# Patient Record
Sex: Male | Born: 2000 | Race: Black or African American | Hispanic: No | Marital: Single | Smoking: Never smoker
Health system: Southern US, Community
[De-identification: ages and names within clinical notes are randomized; demographics above are authoritative.]

## PROBLEM LIST (undated history)

## (undated) ENCOUNTER — Ambulatory Visit: Admission: EM | Payer: Self-pay | Source: Home / Self Care

## (undated) ENCOUNTER — Ambulatory Visit (HOSPITAL_COMMUNITY): Admission: EM | Payer: MEDICAID | Source: Home / Self Care

## (undated) DIAGNOSIS — F909 Attention-deficit hyperactivity disorder, unspecified type: Secondary | ICD-10-CM

## (undated) DIAGNOSIS — Z9109 Other allergy status, other than to drugs and biological substances: Secondary | ICD-10-CM

## (undated) HISTORY — PX: NO PAST SURGERIES: SHX2092

---
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
## 2021-02-28 ENCOUNTER — Ambulatory Visit (HOSPITAL_COMMUNITY)
Admission: EM | Admit: 2021-02-28 | Discharge: 2021-02-28 | Disposition: A | Discharge status: Home / Self Care | Payer: Medicaid Other | Attending: Family Medicine | Admitting: Family Medicine

## 2021-02-28 ENCOUNTER — Encounter (HOSPITAL_COMMUNITY): Payer: Self-pay | Admitting: *Deleted

## 2021-02-28 ENCOUNTER — Other Ambulatory Visit: Payer: Self-pay

## 2021-02-28 DIAGNOSIS — R369 Urethral discharge, unspecified: Secondary | ICD-10-CM | POA: Diagnosis present

## 2021-02-28 DIAGNOSIS — Z711 Person with feared health complaint in whom no diagnosis is made: Secondary | ICD-10-CM

## 2021-02-28 MED ORDER — DOXYCYCLINE HYCLATE 100 MG PO CAPS: 100.0000 mg | ORAL_CAPSULE | Freq: Two times a day (BID) | ORAL | 0 refills | Status: DC

## 2021-02-28 MED ORDER — CEFTRIAXONE SODIUM 500 MG IJ SOLR
500.0000 mg | Freq: Once | INTRAMUSCULAR | Status: AC
  Administered 2021-02-28: 500 mg via INTRAMUSCULAR

## 2021-02-28 MED ORDER — CEFTRIAXONE SODIUM 500 MG IJ SOLR
INTRAMUSCULAR | Status: AC
  Filled 2021-02-28: qty 500

## 2021-02-28 MED ORDER — LIDOCAINE HCL (PF) 1 % IJ SOLN
INTRAMUSCULAR | Status: AC
  Filled 2021-02-28: qty 2

## 2021-02-28 NOTE — ED Provider Notes (Signed)
[facility]   [id number] 02/28/21 Arrival Time: 1552  ASSESSMENT & PLAN:  1. Penile discharge   2. Concern about STD in male without diagnosis    Meds ordered this encounter  Medications  . cefTRIAXone (ROCEPHIN) injection 500 mg  . doxycycline (VIBRAMYCIN) 100 MG capsule    Sig: Take 1 capsule (100 mg total) by mouth 2 (two) times daily.    Dispense:  14 capsule    Refill:  0     Discharge Instructions     You have been given the following today for treatment of suspected gonorrhea and/or chlamydia:  cefTRIAXone (ROCEPHIN) injection 500 mg  Please pick up your prescription for doxycycline 100 mg and begin taking twice daily for the next seven (7) days.  Even though we have treated you today, we have sent testing for sexually transmitted infections. We will notify you of any positive results once they are received. If required, we will prescribe any medications you might need.  Please refrain from all sexual activity for at least the next seven days.     Pending: Labs Reviewed  CYTOLOGY, (ORAL, ANAL, URETHRAL) ANCILLARY ONLY   Declines HIV/RPR.  Will notify of any positive results. Instructed to refrain from sexual activity for at least seven days.  Reviewed expectations re: course of current medical issues. Questions answered. Outlined signs and symptoms indicating need for more acute intervention. Patient verbalized understanding. After Visit Summary given.   SUBJECTIVE:  Brian Dawson is a 20 y.o. male who presents with complaint of penile discharge. Onset abrupt. First noticed yesterday. Describes discharge as thick and green and yellow. No specific aggravating or alleviating factors reported. Denies: urinary frequency, dysuria and gross hematuria. Afebrile. No abdominal or pelvic pain. No n/v. No rashes or lesions. Reports that he is sexually active with single male partner. OTC treatment: none. History of STI: none  reported.  OBJECTIVE:  Vitals:   02/28/21 1643  BP: 123/67  Pulse: (!) 59  Resp: 16  Temp: 98.7 F (37.1 C)  TempSrc: Oral  SpO2: 100%    General appearance: alert, cooperative, appears stated age and no distress Lungs: unlabored respirations; speaks full sentences without difficulty Abdomen: soft, non-tender GU: normal appearing genitalia Skin: warm and dry Psychological: alert and cooperative; normal mood and affect.  Labs Reviewed  CYTOLOGY, (ORAL, ANAL, URETHRAL) ANCILLARY ONLY    No Known Allergies  Past Medical History:  Diagnosis Date  . ADHD (attention deficit hyperactivity disorder)   . Pollen allergies    History reviewed. No pertinent family history. Social History   Socioeconomic History  . Marital status: Single    Spouse name: Not on file  . Number of children: Not on file  . Years of education: Not on file  . Highest education level: Not on file  Occupational History  . Not on file  Tobacco Use  . Smoking status: Never Smoker  . Smokeless tobacco: Never Used  Substance and Sexual Activity  . Alcohol use: Not on file  . Drug use: Not on file  . Sexual activity: Not on file  Other Topics Concern  . Not on file  Social History Narrative  . Not on file   Social Determinants of Health   Financial Resource Strain: Not on file  Food Insecurity: Not on file  Transportation Needs: Not on file  Physical Activity: Not on file  Stress: Not on file  Social Connections: Not on file  Intimate Partner Violence: Not on file  Mardella Layman, MD 02/28/21 1715

## 2021-02-28 NOTE — Discharge Instructions (Addendum)

## 2021-02-28 NOTE — ED Triage Notes (Signed)
STD no sx till yesterday . Penis diccharge.

## 2021-03-01 LAB — CYTOLOGY, (ORAL, ANAL, URETHRAL) ANCILLARY ONLY
Chlamydia: NEGATIVE
Comment: NEGATIVE
Comment: NEGATIVE
Comment: NORMAL
Neisseria Gonorrhea: POSITIVE — AB
Trichomonas: NEGATIVE

## 2021-03-23 ENCOUNTER — Ambulatory Visit (HOSPITAL_BASED_OUTPATIENT_CLINIC_OR_DEPARTMENT_OTHER): Payer: Medicaid Other | Admitting: Nurse Practitioner

## 2021-06-26 ENCOUNTER — Encounter (HOSPITAL_COMMUNITY): Payer: Self-pay

## 2021-06-26 ENCOUNTER — Ambulatory Visit (HOSPITAL_COMMUNITY)
Admission: EM | Admit: 2021-06-26 | Discharge: 2021-06-26 | Disposition: A | Discharge status: Home / Self Care | Payer: Medicaid Other | Attending: Student | Admitting: Student

## 2021-06-26 DIAGNOSIS — R300 Dysuria: Secondary | ICD-10-CM | POA: Diagnosis not present

## 2021-06-26 DIAGNOSIS — Z113 Encounter for screening for infections with a predominantly sexual mode of transmission: Secondary | ICD-10-CM | POA: Insufficient documentation

## 2021-06-26 MED ORDER — CEFTRIAXONE SODIUM 500 MG IJ SOLR
500.0000 mg | Freq: Once | INTRAMUSCULAR | Status: AC
  Administered 2021-06-26: 500 mg via INTRAMUSCULAR

## 2021-06-26 MED ORDER — LIDOCAINE HCL (PF) 1 % IJ SOLN
INTRAMUSCULAR | Status: AC
  Filled 2021-06-26: qty 2

## 2021-06-26 MED ORDER — CEFTRIAXONE SODIUM 500 MG IJ SOLR
INTRAMUSCULAR | Status: AC
  Filled 2021-06-26: qty 500

## 2021-06-26 NOTE — Discharge Instructions (Addendum)
-  We have sent testing for sexually transmitted infections. We will notify you of any positive results once they are received. If required, we will prescribe any additional medications you might need. Please refrain from all sexual activity until treatment is complete.  -Seek additional medical attention if you develop fevers/chills, new/worsening abdominal pain, new/worsening vaginal discomfort/discharge, etc.

## 2021-06-26 NOTE — ED Triage Notes (Signed)
Pt in with c/o pain during urination and possible std exposure  States he received messages stating that he needs to go get tested

## 2021-06-26 NOTE — ED Notes (Signed)
Called in lobby no answered.  

## 2021-06-26 NOTE — ED Provider Notes (Signed)
[facility]    CSN: [id number] Arrival date & time: 06/26/21  1033      History   Chief Complaint Chief Complaint  Patient presents with   pain during urination    HPI Brian Dawson is a 20 y.o. male presenting with dysuria x3 days following unprotected intercourse with male partner. States this partner gave him gonorrhea about 3 months ago, he was treated at our urgent care with resolution of the symptoms at the time.  Had repeat unprotected intercourse with same partner and states he does not think that she was treated.  One of her other partners actually texted him this morning saying that he should get checked for STIs, and so he is concerned that he has gonorrhea again.  Endorses dysuria, but denies other penile symptoms including discharge, smell, swelling, lesions.  HPI  Past Medical History:  Diagnosis Date   ADHD (attention deficit hyperactivity disorder)    Pollen allergies     There are no problems to display for this patient.   History reviewed. No pertinent surgical history.     Home Medications    Prior to Admission medications   Medication Sig Start Date End Date Taking? Authorizing Provider  cephALEXin (KEFLEX) 500 MG capsule Take 1 capsule (500 mg total) by mouth 4 (four) times daily. 09/26/16   Muthersbaugh, Dahlia Client, PA-C  Dexmethylphenidate HCl 30 MG CP24 Take 1 capsule (30 mg total) by mouth daily after breakfast. 10/03/15 10/12/15  Dowless, Lelon Mast Tripp, PA-C  doxycycline (VIBRAMYCIN) 100 MG capsule Take 1 capsule (100 mg total) by mouth 2 (two) times daily. 02/28/21   Mardella Layman, MD  hydrocortisone 2.5 % lotion Apply topically 2 (two) times daily. For one week 11/29/14   Truddie Coco, DO  naproxen (NAPROSYN) 500 MG tablet Take 1 tablet (500 mg total) by mouth 2 (two) times daily. 03/11/16   Niel Hummer, MD  ondansetron (ZOFRAN ODT) 4 MG disintegrating tablet Take 1 tablet (4 mg total) by mouth every 8 (eight) hours as needed. 01/17/20    Mabe, Latanya Maudlin, MD  polyethylene glycol powder (GLYCOLAX/MIRALAX) powder 1 capful in 8 oz of liquid daily as needed to have 1-2 soft bm 12/10/14   Niel Hummer, MD    Family History History reviewed. No pertinent family history.  Social History Social History   Tobacco Use   Smoking status: Never   Smokeless tobacco: Never  Vaping Use   Vaping Use: Never used  Substance Use Topics   Alcohol use: Never   Drug use: Never     Allergies   Patient has no known allergies.   Review of Systems Review of Systems  Constitutional:  Negative for chills and fever.  HENT:  Negative for sore throat.   Eyes:  Negative for pain and redness.  Respiratory:  Negative for shortness of breath.   Cardiovascular:  Negative for chest pain.  Gastrointestinal:  Negative for abdominal pain, diarrhea, nausea and vomiting.  Genitourinary:  Positive for dysuria. Negative for decreased urine volume, difficulty urinating, flank pain, frequency, genital sores, hematuria, penile pain, penile swelling, scrotal swelling, testicular pain and urgency.  Musculoskeletal:  Negative for back pain.  Skin:  Negative for rash.  All other systems reviewed and are negative.   Physical Exam Triage Vital Signs ED Triage Vitals  Enc Vitals Group     BP      Pulse      Resp      Temp      Temp src  SpO2      Weight      Height      Head Circumference      Peak Flow      Pain Score      Pain Loc      Pain Edu?      Excl. in GC?    No data found.  Updated Vital Signs BP 132/89 (BP Location: Right Arm)   Pulse 73   Temp 99.1 F (37.3 C) (Oral)   Resp 20   SpO2 97%   Visual Acuity Right Eye Distance:   Left Eye Distance:   Bilateral Distance:    Right Eye Near:   Left Eye Near:    Bilateral Near:     Physical Exam Vitals reviewed.  Constitutional:      General: He is not in acute distress.    Appearance: Normal appearance. He is not ill-appearing.  HENT:     Head: Normocephalic and  atraumatic.     Mouth/Throat:     Mouth: Mucous membranes are moist.     Comments: Moist mucous membranes Eyes:     Extraocular Movements: Extraocular movements intact.     Pupils: Pupils are equal, round, and reactive to light.  Cardiovascular:     Rate and Rhythm: Normal rate and regular rhythm.     Heart sounds: Normal heart sounds.  Pulmonary:     Effort: Pulmonary effort is normal.     Breath sounds: Normal breath sounds. No wheezing, rhonchi or rales.  Abdominal:     General: Bowel sounds are normal. There is no distension.     Palpations: Abdomen is soft. There is no mass.     Tenderness: There is no abdominal tenderness. There is no right CVA tenderness, left CVA tenderness, guarding or rebound.  Genitourinary:    Comments: deferred Skin:    General: Skin is warm.     Capillary Refill: Capillary refill takes less than 2 seconds.     Comments: Good skin turgor  Neurological:     General: No focal deficit present.     Mental Status: He is alert and oriented to person, place, and time.  Psychiatric:        Mood and Affect: Mood normal.        Behavior: Behavior normal.     UC Treatments / Results  Labs (all labs ordered are listed, but only abnormal results are displayed) Labs Reviewed  CYTOLOGY, (ORAL, ANAL, URETHRAL) ANCILLARY ONLY    EKG   Radiology No results found.  Procedures Procedures (including critical care time)  Medications Ordered in UC Medications  cefTRIAXone (ROCEPHIN) injection 500 mg (has no administration in time range)    Initial Impression / Assessment and Plan / UC Course  I have reviewed the triage vital signs and the nursing notes.  Pertinent labs & imaging results that were available during my care of the patient were reviewed by me and considered in my medical decision making (see chart for details).     This patient is a very pleasant 20 y.o. year old male presenting with dysuria/ concern for STI. Afebrile, nontachycardic, no  reproducible abd pain or CVAT.  Positive for gonorrhea 02/2021, treated appropriately with Rocephin. Recently had repeat unprotected intercourse with same partner.   Will send self-swab for G/C, trich. Declines HIV, RPR. Safe sex precautions.   Will treat with Rocephin today.   ED return precautions discussed. Patient verbalizes understanding and agreement.   Coding Level 4 for  review of past notes/labs, order and interpretation of labs today, and prescription drug management  Final Clinical Impressions(s) / UC Diagnoses   Final diagnoses:  Dysuria  Routine screening for STI (sexually transmitted infection)     Discharge Instructions      -We have sent testing for sexually transmitted infections. We will notify you of any positive results once they are received. If required, we will prescribe any additional medications you might need. Please refrain from all sexual activity until treatment is complete.  -Seek additional medical attention if you develop fevers/chills, new/worsening abdominal pain, new/worsening vaginal discomfort/discharge, etc.       ED Prescriptions   None    PDMP not reviewed this encounter.   Rhys Martini, PA-C 06/26/21 1202

## 2021-06-28 LAB — CYTOLOGY, (ORAL, ANAL, URETHRAL) ANCILLARY ONLY
Chlamydia: NEGATIVE
Comment: NEGATIVE
Comment: NEGATIVE
Comment: NORMAL
Neisseria Gonorrhea: POSITIVE — AB
Trichomonas: NEGATIVE

## 2022-03-09 ENCOUNTER — Ambulatory Visit (HOSPITAL_COMMUNITY): Payer: Self-pay

## 2022-05-03 ENCOUNTER — Ambulatory Visit (HOSPITAL_COMMUNITY): Payer: Self-pay

## 2022-05-31 ENCOUNTER — Ambulatory Visit (HOSPITAL_COMMUNITY): Payer: Self-pay

## 2022-06-05 ENCOUNTER — Encounter (HOSPITAL_COMMUNITY): Payer: Self-pay | Admitting: Emergency Medicine

## 2022-06-05 ENCOUNTER — Ambulatory Visit (HOSPITAL_COMMUNITY)
Admission: EM | Admit: 2022-06-05 | Discharge: 2022-06-05 | Disposition: A | Discharge status: Home / Self Care | Payer: Medicaid Other | Attending: Physician Assistant | Admitting: Physician Assistant

## 2022-06-05 ENCOUNTER — Ambulatory Visit (HOSPITAL_COMMUNITY): Admission: EM | Admit: 2022-06-05 | Discharge: 2022-06-05 | Discharge status: Absent without leave | Payer: Self-pay

## 2022-06-05 DIAGNOSIS — Z113 Encounter for screening for infections with a predominantly sexual mode of transmission: Secondary | ICD-10-CM | POA: Insufficient documentation

## 2022-06-05 DIAGNOSIS — Z202 Contact with and (suspected) exposure to infections with a predominantly sexual mode of transmission: Secondary | ICD-10-CM | POA: Diagnosis present

## 2022-06-05 LAB — HIV ANTIBODY (ROUTINE TESTING W REFLEX): HIV Screen 4th Generation wRfx: NONREACTIVE

## 2022-06-05 MED ORDER — LIDOCAINE HCL (PF) 1 % IJ SOLN
INTRAMUSCULAR | Status: AC
  Filled 2022-06-05: qty 2

## 2022-06-05 MED ORDER — CEFTRIAXONE SODIUM 500 MG IJ SOLR
INTRAMUSCULAR | Status: AC
  Filled 2022-06-05: qty 500

## 2022-06-05 MED ORDER — DOXYCYCLINE HYCLATE 100 MG PO CAPS: 100.0000 mg | ORAL_CAPSULE | Freq: Two times a day (BID) | ORAL | 0 refills | Status: DC

## 2022-06-05 MED ORDER — CEFTRIAXONE SODIUM 500 MG IJ SOLR
500.0000 mg | Freq: Once | INTRAMUSCULAR | Status: AC
  Administered 2022-06-05: 500 mg via INTRAMUSCULAR

## 2022-06-05 NOTE — Discharge Instructions (Signed)
We are treating you for gonorrhea and chlamydia.  We will contact you if your testing is positive for anything else that we need to arrange treatment.  Please abstain from sex for him 7 days after you complete your treatment (a minimum of 2 weeks).  You should use a condom with each sexual encounter.  If you develop any symptoms please return for reevaluation.

## 2022-06-05 NOTE — ED Triage Notes (Signed)
Pt sexual partner informed that they were positive for gonorrhea and chlamydia, so patient needing treatment. Denies any s/s.

## 2022-06-05 NOTE — ED Provider Notes (Signed)
[facility]    CSN: [id number] Arrival date & time: 06/05/22  0957      History   Chief Complaint Chief Complaint  Patient presents with   Exposure to STD    HPI Brian Dawson is a 21 y.o. male.   Patient presents today requesting treatment for gonorrhea and chlamydia.  Reports that he is sexually active with a male partner and she recently went to her OB/GYN and was told that she is positive for gonorrhea and chlamydia.  He is currently asymptomatic but requesting treatment.  He does have a history of STI and has been treated as recently as September 2022.  He denies any additional antibiotic use recently.  He denies any penile discharge, pelvic pain, abdominal pain, fever, nausea, vomiting.    Past Medical History:  Diagnosis Date   ADHD (attention deficit hyperactivity disorder)    Pollen allergies     There are no problems to display for this patient.   History reviewed. No pertinent surgical history.     Home Medications    Prior to Admission medications   Medication Sig Start Date End Date Taking? Authorizing Provider  Dexmethylphenidate HCl 30 MG CP24 Take 1 capsule (30 mg total) by mouth daily after breakfast. 10/03/15 10/12/15  Dowless, Lelon Mast Tripp, PA-C  doxycycline (VIBRAMYCIN) 100 MG capsule Take 1 capsule (100 mg total) by mouth 2 (two) times daily. 06/05/22   Kahron Kauth, Noberto Retort, PA-C    Family History History reviewed. No pertinent family history.  Social History Social History   Tobacco Use   Smoking status: Never   Smokeless tobacco: Never  Vaping Use   Vaping Use: Never used  Substance Use Topics   Alcohol use: Never   Drug use: Never     Allergies   Patient has no known allergies.   Review of Systems Review of Systems  Constitutional:  Negative for activity change, appetite change, fatigue and fever.  Gastrointestinal:  Negative for abdominal pain, diarrhea, nausea and vomiting.  Genitourinary:  Negative for dysuria,  frequency, penile pain, testicular pain and urgency.     Physical Exam Triage Vital Signs ED Triage Vitals  Enc Vitals Group     BP 06/05/22 1057 125/80     Pulse Rate 06/05/22 1057 (!) 53     Resp 06/05/22 1057 14     Temp 06/05/22 1057 98.4 F (36.9 C)     Temp src --      SpO2 06/05/22 1057 98 %     Weight --      Height --      Head Circumference --      Peak Flow --      Pain Score 06/05/22 1055 0     Pain Loc --      Pain Edu? --      Excl. in GC? --    No data found.  Updated Vital Signs BP 125/80 (BP Location: Left Arm)   Pulse (!) 53   Temp 98.4 F (36.9 C)   Resp 14   SpO2 98%   Visual Acuity Right Eye Distance:   Left Eye Distance:   Bilateral Distance:    Right Eye Near:   Left Eye Near:    Bilateral Near:     Physical Exam Vitals reviewed.  Constitutional:      General: He is awake.     Appearance: Normal appearance. He is well-developed. He is not ill-appearing.     Comments: Very pleasant male  appears stated age in no acute distress sitting comfortably in exam room  HENT:     Head: Normocephalic and atraumatic.     Mouth/Throat:     Pharynx: No oropharyngeal exudate, posterior oropharyngeal erythema or uvula swelling.  Cardiovascular:     Rate and Rhythm: Normal rate and regular rhythm.     Heart sounds: Normal heart sounds, S1 normal and S2 normal. No murmur heard. Pulmonary:     Effort: Pulmonary effort is normal.     Breath sounds: Normal breath sounds. No stridor. No wheezing, rhonchi or rales.     Comments: Clear to auscultation bilaterally Abdominal:     General: Bowel sounds are normal.     Palpations: Abdomen is soft.     Tenderness: There is no abdominal tenderness. There is no right CVA tenderness, left CVA tenderness, guarding or rebound.     Comments: Benign abdominal exam  Neurological:     Mental Status: He is alert.  Psychiatric:        Behavior: Behavior is cooperative.      UC Treatments / Results  Labs (all  labs ordered are listed, but only abnormal results are displayed) Labs Reviewed  HIV ANTIBODY (ROUTINE TESTING W REFLEX)  RPR  CYTOLOGY, (ORAL, ANAL, URETHRAL) ANCILLARY ONLY    EKG   Radiology No results found.  Procedures Procedures (including critical care time)  Medications Ordered in UC Medications  cefTRIAXone (ROCEPHIN) injection 500 mg (has no administration in time range)    Initial Impression / Assessment and Plan / UC Course  I have reviewed the triage vital signs and the nursing notes.  Pertinent labs & imaging results that were available during my care of the patient were reviewed by me and considered in my medical decision making (see chart for details).     Given known exposure to STI we will treat him for gonorrhea and chlamydia.  STI testing was obtained today-results pending.  We will contact him if he need to arrange any additional treatment based on laboratory results.  Discussed the importance of safe sex practices.  He is to abstain from sex for 7 days after completing treatment (a minimum of 2 weeks).  If he develops any symptoms he is to return for reevaluation.  Final Clinical Impressions(s) / UC Diagnoses   Final diagnoses:  Exposure to STD  Exposure to gonorrhea  Exposure to chlamydia  Routine screening for STI (sexually transmitted infection)     Discharge Instructions      We are treating you for gonorrhea and chlamydia.  We will contact you if your testing is positive for anything else that we need to arrange treatment.  Please abstain from sex for him 7 days after you complete your treatment (a minimum of 2 weeks).  You should use a condom with each sexual encounter.  If you develop any symptoms please return for reevaluation.     ED Prescriptions     Medication Sig Dispense Auth. Provider   doxycycline (VIBRAMYCIN) 100 MG capsule Take 1 capsule (100 mg total) by mouth 2 (two) times daily. 14 capsule Jamelyn Bovard K, PA-C      PDMP  not reviewed this encounter.   Jeani Hawking, PA-C 06/05/22 1143

## 2022-06-06 LAB — CYTOLOGY, (ORAL, ANAL, URETHRAL) ANCILLARY ONLY
Chlamydia: POSITIVE — AB
Comment: NEGATIVE
Comment: NEGATIVE
Comment: NORMAL
Neisseria Gonorrhea: POSITIVE — AB
Trichomonas: NEGATIVE

## 2022-06-06 LAB — RPR: RPR Ser Ql: NONREACTIVE

## 2022-10-20 ENCOUNTER — Encounter (HOSPITAL_COMMUNITY): Payer: Self-pay | Admitting: *Deleted

## 2022-10-20 ENCOUNTER — Ambulatory Visit (HOSPITAL_COMMUNITY)
Admission: EM | Admit: 2022-10-20 | Discharge: 2022-10-20 | Disposition: A | Discharge status: Home / Self Care | Payer: Medicaid Other | Attending: Family Medicine | Admitting: Family Medicine

## 2022-10-20 ENCOUNTER — Other Ambulatory Visit: Payer: Self-pay

## 2022-10-20 DIAGNOSIS — J069 Acute upper respiratory infection, unspecified: Secondary | ICD-10-CM

## 2022-10-20 DIAGNOSIS — Z1152 Encounter for screening for COVID-19: Secondary | ICD-10-CM | POA: Diagnosis not present

## 2022-10-20 DIAGNOSIS — J029 Acute pharyngitis, unspecified: Secondary | ICD-10-CM | POA: Insufficient documentation

## 2022-10-20 LAB — POCT RAPID STREP A, ED / UC: Streptococcus, Group A Screen (Direct): NEGATIVE

## 2022-10-20 MED ORDER — OSELTAMIVIR PHOSPHATE 75 MG PO CAPS: 75.0000 mg | ORAL_CAPSULE | Freq: Two times a day (BID) | ORAL | 0 refills | Status: DC

## 2022-10-20 NOTE — ED Triage Notes (Signed)
Pt reports since Friday he has hot/cold flases,sore throat,ear pain and HA. Pt has tried OTC with out relief.

## 2022-10-20 NOTE — Discharge Instructions (Addendum)
The strep test was negative.  Take oseltamivir 75 mg--1 capsule 2 times daily for 5 days; this medication is to treat possible flu   You have been swabbed for COVID, and the test will result in the next 24 hours. Our staff will call you if positive. If the COVID test is positive, you should quarantine for 5 days from the start of your symptoms

## 2022-10-20 NOTE — ED Provider Notes (Addendum)
[facility]    CSN: [id number] Arrival date & time: 10/20/22  1055      History   Chief Complaint Chief Complaint  Patient presents with   Sore Throat   Otalgia   Fatigue    HPI Brian Dawson is a 21 y.o. male.    Sore Throat  Otalgia  Here for sore throat, malaise and myalgia, and possible fever.  He is also had cough and nasal congestion.  No vomiting or diarrhea.  Symptoms began on December 29.  Past Medical History:  Diagnosis Date   ADHD (attention deficit hyperactivity disorder)    Pollen allergies     There are no problems to display for this patient.   History reviewed. No pertinent surgical history.     Home Medications    Prior to Admission medications   Medication Sig Start Date End Date Taking? Authorizing Provider  oseltamivir (TAMIFLU) 75 MG capsule Take 1 capsule (75 mg total) by mouth every 12 (twelve) hours. 10/20/22  Yes Zenia Resides, MD    Family History History reviewed. No pertinent family history.  Social History Social History   Tobacco Use   Smoking status: Never   Smokeless tobacco: Never  Vaping Use   Vaping Use: Never used  Substance Use Topics   Alcohol use: Never   Drug use: Never     Allergies   Patient has no known allergies.   Review of Systems Review of Systems  HENT:  Positive for ear pain.      Physical Exam Triage Vital Signs ED Triage Vitals  Enc Vitals Group     BP 10/20/22 1128 128/78     Pulse Rate 10/20/22 1128 90     Resp 10/20/22 1128 18     Temp 10/20/22 1128 99.5 F (37.5 C)     Temp src --      SpO2 10/20/22 1128 94 %     Weight --      Height --      Head Circumference --      Peak Flow --      Pain Score 10/20/22 1126 10     Pain Loc --      Pain Edu? --      Excl. in GC? --    No data found.  Updated Vital Signs BP 128/78   Pulse 90   Temp 99.5 F (37.5 C)   Resp 18   SpO2 94%   Visual Acuity Right Eye Distance:   Left Eye Distance:    Bilateral Distance:    Right Eye Near:   Left Eye Near:    Bilateral Near:     Physical Exam Vitals reviewed.  Constitutional:      General: He is not in acute distress.    Appearance: He is not toxic-appearing.  HENT:     Right Ear: Tympanic membrane and ear canal normal.     Left Ear: Tympanic membrane and ear canal normal.     Nose: Congestion present.     Mouth/Throat:     Mouth: Mucous membranes are moist.     Comments: There is erythema of the tonsillar pillars and posterior oropharynx Eyes:     Extraocular Movements: Extraocular movements intact.     Conjunctiva/sclera: Conjunctivae normal.     Pupils: Pupils are equal, round, and reactive to light.  Cardiovascular:     Rate and Rhythm: Normal rate and regular rhythm.     Heart sounds: No murmur  heard. Pulmonary:     Effort: No respiratory distress.     Breath sounds: No stridor. No wheezing, rhonchi or rales.  Musculoskeletal:     Cervical back: Neck supple.  Lymphadenopathy:     Cervical: No cervical adenopathy (Anterior cervical adenopathy).  Skin:    Capillary Refill: Capillary refill takes less than 2 seconds.     Coloration: Skin is not jaundiced or pale.  Neurological:     General: No focal deficit present.     Mental Status: He is alert and oriented to person, place, and time.  Psychiatric:        Behavior: Behavior normal.      UC Treatments / Results  Labs (all labs ordered are listed, but only abnormal results are displayed) Labs Reviewed  SARS CORONAVIRUS 2 (TAT 6-24 HRS)  POCT RAPID STREP A, ED / UC    EKG   Radiology No results found.  Procedures Procedures (including critical care time)  Medications Ordered in UC Medications - No data to display  Initial Impression / Assessment and Plan / UC Course  I have reviewed the triage vital signs and the nursing notes.  Pertinent labs & imaging results that were available during my care of the patient were reviewed by me and considered  in my medical decision making (see chart for details).        Rapid strep is negative.  I am going to treat for influenza-like illness with Tamiflu, and staff will do a COVID swab.  If positive for COVID he will know if he needs to quarantine. Final Clinical Impressions(s) / UC Diagnoses   Final diagnoses:  Viral upper respiratory tract infection  Sore throat     Discharge Instructions      The strep test was negative.  Take oseltamivir 75 mg--1 capsule 2 times daily for 5 days; this medication is to treat possible flu   You have been swabbed for COVID, and the test will result in the next 24 hours. Our staff will call you if positive. If the COVID test is positive, you should quarantine for 5 days from the start of your symptoms       ED Prescriptions     Medication Sig Dispense Auth. Provider   oseltamivir (TAMIFLU) 75 MG capsule Take 1 capsule (75 mg total) by mouth every 12 (twelve) hours. 10 capsule Zenia Resides, MD      PDMP not reviewed this encounter.   Zenia Resides, MD 10/20/22 1209    Zenia Resides, MD 10/20/22 1210

## 2022-10-21 LAB — SARS CORONAVIRUS 2 (TAT 6-24 HRS): SARS Coronavirus 2: NEGATIVE

## 2022-11-21 ENCOUNTER — Emergency Department (HOSPITAL_BASED_OUTPATIENT_CLINIC_OR_DEPARTMENT_OTHER): Payer: Medicaid Other

## 2022-11-21 ENCOUNTER — Emergency Department (HOSPITAL_BASED_OUTPATIENT_CLINIC_OR_DEPARTMENT_OTHER): Payer: Medicaid Other | Admitting: Radiology

## 2022-11-21 ENCOUNTER — Emergency Department (HOSPITAL_BASED_OUTPATIENT_CLINIC_OR_DEPARTMENT_OTHER)
Admission: EM | Admit: 2022-11-21 | Discharge: 2022-11-22 | Disposition: A | Discharge status: Home / Self Care | Payer: Medicaid Other | Attending: Emergency Medicine | Admitting: Emergency Medicine

## 2022-11-21 DIAGNOSIS — S02832A Fracture of medial orbital wall, left side, initial encounter for closed fracture: Secondary | ICD-10-CM

## 2022-11-21 DIAGNOSIS — S022XXA Fracture of nasal bones, initial encounter for closed fracture: Secondary | ICD-10-CM | POA: Diagnosis not present

## 2022-11-21 DIAGNOSIS — S0992XA Unspecified injury of nose, initial encounter: Secondary | ICD-10-CM | POA: Diagnosis present

## 2022-11-21 MED ORDER — FLUORESCEIN SODIUM 1 MG OP STRP
1.0000 | ORAL_STRIP | Freq: Once | OPHTHALMIC | Status: AC
  Administered 2022-11-21: 1 via OPHTHALMIC
  Filled 2022-11-21: qty 1

## 2022-11-21 MED ORDER — HYDROCODONE-ACETAMINOPHEN 5-325 MG PO TABS
1.0000 | ORAL_TABLET | Freq: Once | ORAL | Status: AC
  Administered 2022-11-21: 1 via ORAL
  Filled 2022-11-21: qty 1

## 2022-11-21 MED ORDER — TETRACAINE HCL 0.5 % OP SOLN
2.0000 [drp] | Freq: Once | OPHTHALMIC | Status: AC
  Administered 2022-11-21: 2 [drp] via OPHTHALMIC
  Filled 2022-11-21: qty 4

## 2022-11-21 NOTE — Discharge Instructions (Addendum)
Thank you for allowing me to be part of your care today.  You have a broken nose and a broken bone in your eye socket on the left side.  I have sent a prescription for pain medicine to the pharmacy as well as an antibiotic to prevent infection.  Please start taking the antibiotic tonight.   DO NOT blow your nose or sneeze with your mouth closed.  This will cause pressure behind the eye and may worsen symptoms.    Dr. Talbert Forest will see you in the office tomorrow afternoon at 12:30.  Return to the ED if you experience sudden worsening of your symptoms, vision loss, severe headache, vomiting, or have any new concerns.
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
## 2022-11-22 ENCOUNTER — Other Ambulatory Visit (HOSPITAL_BASED_OUTPATIENT_CLINIC_OR_DEPARTMENT_OTHER): Payer: Self-pay

## 2022-11-22 MED ORDER — HYDROCODONE-ACETAMINOPHEN 5-325 MG PO TABS: 1.0000 | ORAL_TABLET | Freq: Four times a day (QID) | ORAL | 0 refills | Status: AC | PRN

## 2022-11-22 MED ORDER — AMOXICILLIN-POT CLAVULANATE 875-125 MG PO TABS: 1.0000 | ORAL_TABLET | Freq: Two times a day (BID) | ORAL | 0 refills | Status: DC

## 2022-11-27 ENCOUNTER — Other Ambulatory Visit: Payer: Self-pay | Admitting: Otolaryngology

## 2022-11-29 ENCOUNTER — Encounter (HOSPITAL_BASED_OUTPATIENT_CLINIC_OR_DEPARTMENT_OTHER): Payer: Self-pay | Admitting: Otolaryngology

## 2022-11-29 ENCOUNTER — Other Ambulatory Visit: Payer: Self-pay

## 2022-12-03 NOTE — H&P (Signed)
Brian Dawson is an 22 y.o. male.    Chief Complaint:  Nasal bone fractures  HPI: Patient presents today for planned elective procedure.  He/she denies any interval change in history since office visit on 11/26/22.   Past Medical History:  Diagnosis Date   ADHD (attention deficit hyperactivity disorder)    Pollen allergies     Past Surgical History:  Procedure Laterality Date   NO PAST SURGERIES      History reviewed. No pertinent family history.  Social History:  reports that he has never smoked. He has never used smokeless tobacco. He reports that he does not drink alcohol and does not use drugs.  Allergies: No Known Allergies  No medications prior to admission.    No results found for this or any previous visit (from the past 48 hour(s)). No results found.  ROS: negative other than stated in HPI  Height 5' 9"$  (1.753 m), weight 77 kg.  PHYSICAL EXAM: General: Resting comfortably in NAD  Lungs: Non-labored respiratinos  Studies Reviewed: CT maxillofacial 11/21/22  IMPRESSION: Right nasal bone fracture and fracture of the medial wall of the left orbit with resultant orbital emphysema. No other bony abnormality is noted.   Air-fluid levels in the maxillary antra bilaterally related to the recent injury.    Assessment/Plan Nasal bone fractures Acquired nasal deformity  Proceed with closed reduction nasal bone fractures with splint stabilization. Discussed R/B/A. Discussed risks including pain, bleeding, infection, bruising, poor cosmesis, need for further procedures, risk of anesthesia. Despite these risks patient wished to proceed     Electronically signed by:  Jenetta Downer, MD  Staff Physician [facility] Otolaryngology - Head and Neck Surgery [place]  12/03/2022, 8:47 PM

## 2022-12-04 ENCOUNTER — Other Ambulatory Visit: Payer: Self-pay

## 2022-12-04 ENCOUNTER — Ambulatory Visit (HOSPITAL_BASED_OUTPATIENT_CLINIC_OR_DEPARTMENT_OTHER): Payer: Medicaid Other | Admitting: Anesthesiology

## 2022-12-04 ENCOUNTER — Ambulatory Visit (HOSPITAL_BASED_OUTPATIENT_CLINIC_OR_DEPARTMENT_OTHER)
Admission: RE | Admit: 2022-12-04 | Discharge: 2022-12-04 | Disposition: A | Discharge status: Home / Self Care | Payer: Medicaid Other | Attending: Otolaryngology | Admitting: Otolaryngology

## 2022-12-04 ENCOUNTER — Encounter (HOSPITAL_BASED_OUTPATIENT_CLINIC_OR_DEPARTMENT_OTHER)
Admission: RE | Disposition: A | Discharge status: Home / Self Care | Payer: Self-pay | Source: Home / Self Care | Attending: Otolaryngology

## 2022-12-04 ENCOUNTER — Encounter (HOSPITAL_BASED_OUTPATIENT_CLINIC_OR_DEPARTMENT_OTHER): Payer: Self-pay | Admitting: Otolaryngology

## 2022-12-04 DIAGNOSIS — X58XXXA Exposure to other specified factors, initial encounter: Secondary | ICD-10-CM | POA: Diagnosis not present

## 2022-12-04 DIAGNOSIS — S022XXA Fracture of nasal bones, initial encounter for closed fracture: Secondary | ICD-10-CM | POA: Insufficient documentation

## 2022-12-04 DIAGNOSIS — S022XXG Fracture of nasal bones, subsequent encounter for fracture with delayed healing: Secondary | ICD-10-CM

## 2022-12-04 DIAGNOSIS — M950 Acquired deformity of nose: Secondary | ICD-10-CM

## 2022-12-04 HISTORY — PX: CLOSED REDUCTION NASAL FRACTURE: SHX5365

## 2022-12-04 SURGERY — CLOSED REDUCTION, FRACTURE, NASAL BONE
Anesthesia: General | Site: Nose

## 2022-12-04 MED ORDER — DEXMEDETOMIDINE HCL IN NACL 80 MCG/20ML IV SOLN
INTRAVENOUS | Status: DC | PRN
  Administered 2022-12-04: 8 ug via BUCCAL

## 2022-12-04 MED ORDER — FENTANYL CITRATE (PF) 100 MCG/2ML IJ SOLN
INTRAMUSCULAR | Status: AC
  Filled 2022-12-04: qty 2

## 2022-12-04 MED ORDER — MIDAZOLAM HCL 2 MG/2ML IJ SOLN
INTRAMUSCULAR | Status: AC
  Filled 2022-12-04: qty 2

## 2022-12-04 MED ORDER — EPINEPHRINE HCL (NASAL) 0.1 % NA SOLN
NASAL | Status: DC | PRN
  Administered 2022-12-04: 10 mL via TOPICAL

## 2022-12-04 MED ORDER — BACITRACIN ZINC 500 UNIT/GM EX OINT
TOPICAL_OINTMENT | CUTANEOUS | Status: AC
  Filled 2022-12-04: qty 28.35

## 2022-12-04 MED ORDER — OXYCODONE HCL 5 MG PO TABS: 5.0000 mg | ORAL_TABLET | Freq: Once | ORAL | Status: DC | PRN

## 2022-12-04 MED ORDER — PROPOFOL 10 MG/ML IV BOLUS
INTRAVENOUS | Status: DC | PRN
  Administered 2022-12-04: 200 mg via INTRAVENOUS
  Administered 2022-12-04: 50 mg via INTRAVENOUS

## 2022-12-04 MED ORDER — ONDANSETRON HCL 4 MG/2ML IJ SOLN
INTRAMUSCULAR | Status: AC
  Filled 2022-12-04: qty 2

## 2022-12-04 MED ORDER — PROPOFOL 10 MG/ML IV BOLUS
INTRAVENOUS | Status: AC
  Filled 2022-12-04: qty 20

## 2022-12-04 MED ORDER — BACITRACIN ZINC 500 UNIT/GM EX OINT
TOPICAL_OINTMENT | CUTANEOUS | Status: DC | PRN
  Administered 2022-12-04: 1 via TOPICAL

## 2022-12-04 MED ORDER — ROCURONIUM BROMIDE 10 MG/ML (PF) SYRINGE
PREFILLED_SYRINGE | INTRAVENOUS | Status: AC
  Filled 2022-12-04: qty 10

## 2022-12-04 MED ORDER — LIDOCAINE-EPINEPHRINE 1 %-1:100000 IJ SOLN
INTRAMUSCULAR | Status: DC | PRN
  Administered 2022-12-04: .5 mL

## 2022-12-04 MED ORDER — LIDOCAINE-EPINEPHRINE 1 %-1:100000 IJ SOLN
INTRAMUSCULAR | Status: AC
  Filled 2022-12-04: qty 1

## 2022-12-04 MED ORDER — FENTANYL CITRATE (PF) 100 MCG/2ML IJ SOLN
INTRAMUSCULAR | Status: DC | PRN
  Administered 2022-12-04 (×2): 50 ug via INTRAVENOUS

## 2022-12-04 MED ORDER — LACTATED RINGERS IV SOLN: INTRAVENOUS | Status: DC

## 2022-12-04 MED ORDER — BUPIVACAINE HCL (PF) 0.25 % IJ SOLN
INTRAMUSCULAR | Status: AC
  Filled 2022-12-04: qty 30

## 2022-12-04 MED ORDER — SUCCINYLCHOLINE CHLORIDE 200 MG/10ML IV SOSY
PREFILLED_SYRINGE | INTRAVENOUS | Status: DC | PRN
  Administered 2022-12-04: 100 mg via INTRAVENOUS

## 2022-12-04 MED ORDER — PROMETHAZINE HCL 25 MG/ML IJ SOLN: 6.2500 mg | INTRAMUSCULAR | Status: DC | PRN

## 2022-12-04 MED ORDER — FLUORESCEIN SODIUM 1 MG OP STRP
ORAL_STRIP | OPHTHALMIC | Status: AC
  Filled 2022-12-04: qty 1

## 2022-12-04 MED ORDER — LIDOCAINE HCL (CARDIAC) PF 100 MG/5ML IV SOSY
PREFILLED_SYRINGE | INTRAVENOUS | Status: DC | PRN
  Administered 2022-12-04: 80 mg via INTRAVENOUS

## 2022-12-04 MED ORDER — LIDOCAINE-EPINEPHRINE (PF) 1 %-1:200000 IJ SOLN
INTRAMUSCULAR | Status: AC
  Filled 2022-12-04: qty 30

## 2022-12-04 MED ORDER — DEXAMETHASONE SODIUM PHOSPHATE 10 MG/ML IJ SOLN
INTRAMUSCULAR | Status: DC | PRN
  Administered 2022-12-04: 10 mg via INTRAVENOUS

## 2022-12-04 MED ORDER — ACETAMINOPHEN 10 MG/ML IV SOLN
INTRAVENOUS | Status: AC
  Filled 2022-12-04: qty 100

## 2022-12-04 MED ORDER — LIDOCAINE 2% (20 MG/ML) 5 ML SYRINGE
INTRAMUSCULAR | Status: AC
  Filled 2022-12-04: qty 5

## 2022-12-04 MED ORDER — MIDAZOLAM HCL 5 MG/5ML IJ SOLN
INTRAMUSCULAR | Status: DC | PRN
  Administered 2022-12-04: 2 mg via INTRAVENOUS

## 2022-12-04 MED ORDER — ACETAMINOPHEN 10 MG/ML IV SOLN
1000.0000 mg | Freq: Once | INTRAVENOUS | Status: AC
  Administered 2022-12-04: 1000 mg via INTRAVENOUS

## 2022-12-04 MED ORDER — CIPROFLOXACIN-DEXAMETHASONE 0.3-0.1 % OT SUSP
OTIC | Status: AC
  Filled 2022-12-04: qty 22.5

## 2022-12-04 MED ORDER — FENTANYL CITRATE (PF) 100 MCG/2ML IJ SOLN
25.0000 ug | INTRAMUSCULAR | Status: DC | PRN
  Administered 2022-12-04: 50 ug via INTRAVENOUS
  Administered 2022-12-04: 25 ug via INTRAVENOUS

## 2022-12-04 MED ORDER — DEXAMETHASONE SODIUM PHOSPHATE 10 MG/ML IJ SOLN
INTRAMUSCULAR | Status: AC
  Filled 2022-12-04: qty 1

## 2022-12-04 MED ORDER — EPINEPHRINE HCL (NASAL) 0.1 % NA SOLN
NASAL | Status: AC
  Filled 2022-12-04: qty 10

## 2022-12-04 MED ORDER — OXYMETAZOLINE HCL 0.05 % NA SOLN
NASAL | Status: AC
  Filled 2022-12-04: qty 30

## 2022-12-04 MED ORDER — ONDANSETRON HCL 4 MG/2ML IJ SOLN
INTRAMUSCULAR | Status: DC | PRN
  Administered 2022-12-04: 4 mg via INTRAVENOUS

## 2022-12-04 MED ORDER — OXYCODONE HCL 5 MG/5ML PO SOLN: 5.0000 mg | Freq: Once | ORAL | Status: DC | PRN

## 2022-12-04 SURGICAL SUPPLY — 46 items
APL SKNCLS STERI-STRIP NONHPOA (GAUZE/BANDAGES/DRESSINGS)
APL SRG 3 HI ABS STRL LF PLS (MISCELLANEOUS)
APPLICATOR DR MATTHEWS STRL (MISCELLANEOUS) IMPLANT
BENZOIN TINCTURE PRP APPL 2/3 (GAUZE/BANDAGES/DRESSINGS) IMPLANT
BLADE SURG 15 STRL LF DISP TIS (BLADE) IMPLANT
BLADE SURG 15 STRL SS (BLADE)
CANISTER SUCT 1200ML W/VALVE (MISCELLANEOUS) ×1 IMPLANT
CNTNR URN SCR LID CUP LEK RST (MISCELLANEOUS) ×1 IMPLANT
CONT SPEC 4OZ STRL OR WHT (MISCELLANEOUS)
COVER BACK TABLE 60X90IN (DRAPES) ×1 IMPLANT
COVER MAYO STAND STRL (DRAPES) ×1 IMPLANT
DEPRESSOR TONGUE 6 IN STERILE (GAUZE/BANDAGES/DRESSINGS) IMPLANT
DRESSING NASAL KENNEDY 3.5X.9 (MISCELLANEOUS) IMPLANT
DRSG NASAL KENNEDY 3.5X.9 (MISCELLANEOUS) ×1
DRSG TEGADERM 2-3/8X2-3/4 SM (GAUZE/BANDAGES/DRESSINGS) IMPLANT
DRSG TELFA 3X8 NADH STRL (GAUZE/BANDAGES/DRESSINGS) IMPLANT
GAUZE SPONGE 4X4 12PLY STRL LF (GAUZE/BANDAGES/DRESSINGS) ×1 IMPLANT
GLOVE BIO SURGEON STRL SZ7.5 (GLOVE) ×1 IMPLANT
GLOVE BIOGEL PI IND STRL 7.5 (GLOVE) IMPLANT
GLOVE BIOGEL PI IND STRL 8 (GLOVE) ×1 IMPLANT
GOWN STRL REUS W/ TWL LRG LVL3 (GOWN DISPOSABLE) ×1 IMPLANT
GOWN STRL REUS W/ TWL XL LVL3 (GOWN DISPOSABLE) ×1 IMPLANT
GOWN STRL REUS W/TWL LRG LVL3 (GOWN DISPOSABLE)
GOWN STRL REUS W/TWL XL LVL3 (GOWN DISPOSABLE) ×1
MANIFOLD NEPTUNE II (INSTRUMENTS) ×1 IMPLANT
MARKER SKIN DUAL TIP RULER LAB (MISCELLANEOUS) ×1 IMPLANT
NDL FILTER BLUNT 18X1 1/2 (NEEDLE) IMPLANT
NDL HYPO 27GX1-1/4 (NEEDLE) IMPLANT
NEEDLE FILTER BLUNT 18X1 1/2 (NEEDLE) IMPLANT
NEEDLE HYPO 27GX1-1/4 (NEEDLE) ×1 IMPLANT
PACK BASIN DAY SURGERY FS (CUSTOM PROCEDURE TRAY) IMPLANT
PATTIES SURGICAL .5 X3 (DISPOSABLE) ×1 IMPLANT
SHEET MEDIUM DRAPE 40X70 STRL (DRAPES) ×1 IMPLANT
SPIKE FLUID TRANSFER (MISCELLANEOUS) IMPLANT
SPLINT NASAL AIRWAY SILICONE (MISCELLANEOUS) IMPLANT
SPLINT NASAL DENVER LRG BLUSH (MISCELLANEOUS) ×1 IMPLANT
SPLINT NASAL DENVER SM/MD BLUS (MISCELLANEOUS) IMPLANT
SPLINT NASAL POSISEP X .6X2 (GAUZE/BANDAGES/DRESSINGS) IMPLANT
SUT ETHILON 3 0 PS 1 (SUTURE) IMPLANT
SUT SILK 2 0 PERMA HAND 18 BK (SUTURE) IMPLANT
SYR 10ML LL (SYRINGE) IMPLANT
SYR CONTROL 10ML LL (SYRINGE) IMPLANT
TAPE PAPER 1/2X10 TAN MEDIPORE (MISCELLANEOUS) ×1 IMPLANT
TOWEL GREEN STERILE FF (TOWEL DISPOSABLE) ×1 IMPLANT
TUBE CONNECTING 20X1/4 (TUBING) ×1 IMPLANT
YANKAUER SUCT BULB TIP NO VENT (SUCTIONS) IMPLANT

## 2022-12-04 NOTE — Transfer of Care (Signed)
Immediate Anesthesia Transfer of Care Note  Patient: Brian Dawson  Procedure(s) Performed: CLOSED REDUCTION NASAL FRACTURE WITH SPLINT (Nose)  Patient Location: PACU  Anesthesia Type:General  Level of Consciousness: awake  Airway & Oxygen Therapy: Patient Spontanous Breathing  Post-op Assessment: Report given to RN  Post vital signs: stable  Last Vitals:  Vitals Value Taken Time  BP 141/97 12/04/22 1359  Temp 37.2 C 12/04/22 1307  Pulse 61 12/04/22 1359  Resp 12 12/04/22 1359  SpO2 96 % 12/04/22 1359    Last Pain:  Vitals:   12/04/22 1338  TempSrc:   PainSc: Asleep      Patients Stated Pain Goal: 3 ([id number])  Complications: No notable events documented.

## 2022-12-04 NOTE — Anesthesia Procedure Notes (Signed)
Procedure Name: Intubation Date/Time: 12/04/2022 12:37 PM  Performed by: Maryella Shivers, CRNAPre-anesthesia Checklist: Patient identified, Emergency Drugs available, Suction available and Patient being monitored Patient Re-evaluated:Patient Re-evaluated prior to induction Oxygen Delivery Method: Circle system utilized Preoxygenation: Pre-oxygenation with 100% oxygen Induction Type: IV induction Ventilation: Mask ventilation without difficulty Laryngoscope Size: Mac and 4 Grade View: Grade I Tube type: Oral Tube size: 7.5 mm Number of attempts: 1 Airway Equipment and Method: Stylet and Oral airway Placement Confirmation: ETT inserted through vocal cords under direct vision, positive ETCO2 and breath sounds checked- equal and bilateral Secured at: 21 cm Tube secured with: Tape Dental Injury: Teeth and Oropharynx as per pre-operative assessment

## 2022-12-04 NOTE — Discharge Instructions (Addendum)
Nasal bone fracture discharge instructions  No heavy lifting for 7 days. OK to return to work in 1 week.   Your nose is delicate for 6 weeks, avoid facial trauma  For the next 3 days use ice packs (I.e. frozen peas or ice packs) 20 minutes on / 20 minutes off at least 3 times daily to reduce bruising and swelling  You have both internal and external nasal splints that will be removed by Dr. Sabino Gasser in 1 week.  Use saline sprays at least 3-4 times daily in both nostrils for nasal humidification  [facility] [address], [address] [place], [place] [address] Phone: [phone]  May take Tylenol after 7:50pm, if needed.   Post Anesthesia Home Care Instructions  Activity: Get plenty of rest for the remainder of the day. A responsible individual must stay with you for 24 hours following the procedure.  For the next 24 hours, DO NOT: -Drive a car -Paediatric nurse -Drink alcoholic beverages -Take any medication unless instructed by your physician -Make any legal decisions or sign important papers.  Meals: Start with liquid foods such as gelatin or soup. Progress to regular foods as tolerated. Avoid greasy, spicy, heavy foods. If nausea and/or vomiting occur, drink only clear liquids until the nausea and/or vomiting subsides. Call your physician if vomiting continues.  Special Instructions/Symptoms: Your throat may feel dry or sore from the anesthesia or the breathing tube placed in your throat during surgery. If this causes discomfort, gargle with warm salt water. The discomfort should disappear within 24 hours.  If you had a scopolamine patch placed behind your ear for the management of post- operative nausea and/or vomiting:  1. The medication in the patch is effective for 72 hours, after which it should be removed.  Wrap patch in a tissue and discard in the trash. Wash hands thoroughly with soap  and water. 2. You may remove the patch earlier than 72 hours if you experience unpleasant side effects which may include dry mouth, dizziness or visual disturbances. 3. Avoid touching the patch. Wash your hands with soap and water after contact with the patch.

## 2022-12-04 NOTE — Anesthesia Postprocedure Evaluation (Signed)
Anesthesia Post Note  Patient: [facility]  Procedure(s) Performed: CLOSED REDUCTION NASAL FRACTURE WITH SPLINT (Nose)     Patient location during evaluation: PACU Anesthesia Type: General Level of consciousness: awake Pain management: pain level controlled Vital Signs Assessment: post-procedure vital signs reviewed and stable Respiratory status: spontaneous breathing, nonlabored ventilation and respiratory function stable Cardiovascular status: blood pressure returned to baseline and stable Postop Assessment: no apparent nausea or vomiting Anesthetic complications: no   No notable events documented.  Last Vitals:  Vitals:   12/04/22 1345 12/04/22 1359  BP: (!) 149/103 (!) 141/97  Pulse: 71 61  Resp: 16 12  Temp:    SpO2: 97% 96%    Last Pain:  Vitals:   12/04/22 1338  TempSrc:   PainSc: Asleep                 Nilda Simmer

## 2022-12-04 NOTE — Op Note (Signed)
OPERATIVE NOTE  Brian [facility] Date/Time of Admission: 12/04/2022 10:29 AM  CSN: [id number] Attending Provider: Jenetta Downer, MD Room/Bed: [facility]/NONE DOB: 06/21/2001 Age: 22 y.o.   Pre-Op Diagnosis: Closed fracture of nasal bone with routine healing; Acquired nasal deformity  Post-Op Diagnosis: Closed fracture of nasal bone with routine healing; Acquired nasal deformity  Procedure: Procedure(s): CLOSED REDUCTION NASAL FRACTURE WITH SPLINT STABILIZATION  Anesthesia: General  Surgeon(s): Pamala Hurry, MD  Staff: Circulator: Ted Mcalpine, RN Scrub Person: Buddy Duty A  Implants: * No implants in log *  Specimens: * No specimens in log *  Complications: none  EBL: minimal ML  IVF: Per anesthesia ML  Condition: stable  Operative Findings:  Right depressed nasal bone fracture   Description of Operation:  The patient was identified in the preoperative area and consent confirmed in the chart.  He was brought to the operating room by the anesthetist and a pre-operative huddle was performed confirming the patient's identity and procedure to be performed.  Once all were in agreement we proceed with surgery.  General anesthesia was induced the patient was intubated with an oral endotracheal tube.  The nose was topically decongested with 1 1000 epinephrine pledgets.  Bilateral infraorbital nerve blocks with 1% lidocaine 1 100,000 were performed percutaneously.  Next a [place] elevator was used to outfracture the right depressed nasal bone fracture into anatomic location.  Hemostasis was achieved with epinephrine pledgets.  Merosel nasal splints x 2 were placed into the internal nasal valve and the strings were taped to the right cheek.  An external Thermoplast splint was molded to the nose after placement of brown paper tape along the nasal dorsum.  The patient was turned back to the anesthetist who extubated and brought him to the recovery room  in stable condition.  All counts were correct and final.  Pamala Hurry, MD [facility]  12/04/2022

## 2022-12-04 NOTE — Anesthesia Preprocedure Evaluation (Addendum)
Anesthesia Evaluation  Patient identified by MRN, date of birth, ID band Patient awake    Reviewed: Allergy & Precautions, NPO status , Patient's Chart, lab work & pertinent test results  History of Anesthesia Complications Negative for: history of anesthetic complications  Airway Mallampati: I  TM Distance: >3 FB Neck ROM: Full    Dental  (+) Dental Advisory Given   Pulmonary neg shortness of breath, neg recent URI, Current Smoker (smokes marijuana) and Patient abstained from smoking.   Pulmonary exam normal breath sounds clear to auscultation       Cardiovascular negative cardio ROS  Rhythm:Regular Rate:Normal     Neuro/Psych  PSYCHIATRIC DISORDERS (ADHD)      negative neurological ROS     GI/Hepatic negative GI ROS,,,(+)     substance abuse  marijuana use  Endo/Other  negative endocrine ROS    Renal/GU negative Renal ROS     Musculoskeletal   Abdominal   Peds  Hematology negative hematology ROS (+)   Anesthesia Other Findings   Reproductive/Obstetrics                             Anesthesia Physical Anesthesia Plan  ASA: 2  Anesthesia Plan: General   Post-op Pain Management:    Induction: Intravenous  PONV Risk Score and Plan: 2 and Ondansetron, Dexamethasone and Treatment may vary due to age or medical condition  Airway Management Planned: Oral ETT  Additional Equipment:   Intra-op Plan:   Post-operative Plan: Extubation in OR  Informed Consent: I have reviewed the patients History and Physical, chart, labs and discussed the procedure including the risks, benefits and alternatives for the proposed anesthesia with the patient or authorized representative who has indicated his/her understanding and acceptance.     Dental advisory given  Plan Discussed with: CRNA and Anesthesiologist  Anesthesia Plan Comments: (Risks of general anesthesia discussed including, but  not limited to, sore throat, hoarse voice, chipped/damaged teeth, injury to vocal cords, nausea and vomiting, allergic reactions, lung infection, heart attack, stroke, and death. All questions answered. )        Anesthesia Quick Evaluation

## 2022-12-05 ENCOUNTER — Encounter (HOSPITAL_BASED_OUTPATIENT_CLINIC_OR_DEPARTMENT_OTHER): Payer: Self-pay | Admitting: Otolaryngology

## 2023-04-08 ENCOUNTER — Encounter: Payer: Self-pay | Admitting: Emergency Medicine

## 2023-04-08 ENCOUNTER — Ambulatory Visit
Admission: EM | Admit: 2023-04-08 | Discharge: 2023-04-08 | Disposition: A | Discharge status: Home / Self Care | Payer: Medicaid Other | Attending: Physician Assistant | Admitting: Physician Assistant

## 2023-04-08 ENCOUNTER — Other Ambulatory Visit: Payer: Self-pay

## 2023-04-08 DIAGNOSIS — F1290 Cannabis use, unspecified, uncomplicated: Secondary | ICD-10-CM | POA: Diagnosis not present

## 2023-04-08 DIAGNOSIS — Z202 Contact with and (suspected) exposure to infections with a predominantly sexual mode of transmission: Secondary | ICD-10-CM | POA: Insufficient documentation

## 2023-04-08 NOTE — ED Triage Notes (Signed)
Pt here for STD screening denies sx  

## 2023-04-08 NOTE — ED Provider Notes (Signed)
[facility]    CSN: [id number] Arrival date & time: 04/08/23  1308      History   Chief Complaint Chief Complaint  Patient presents with   Exposure to STD    HPI Brian Dawson is a 22 y.o. male.   Patient request evaluation for sexually transmitted disease.  Reports no symptoms just wants to get checked   Exposure to STD    Past Medical History:  Diagnosis Date   ADHD (attention deficit hyperactivity disorder)    Pollen allergies     There are no problems to display for this patient.   Past Surgical History:  Procedure Laterality Date   CLOSED REDUCTION NASAL FRACTURE N/A 12/04/2022   Procedure: CLOSED REDUCTION NASAL FRACTURE WITH SPLINT;  Surgeon: Scarlette Ar, MD;  Location: [facility];  Service: ENT;  Laterality: N/A;   NO PAST SURGERIES         Home Medications    Prior to Admission medications   Medication Sig Start Date End Date Taking? Authorizing Provider  HYDROcodone-acetaminophen (NORCO/VICODIN) 5-325 MG tablet Take 1 tablet by mouth every 6 (six) hours as needed. Patient not taking: Reported on 04/08/2023 11/22/22   Lenard Simmer, PA-C    Family History History reviewed. No pertinent family history.  Social History Social History   Tobacco Use   Smoking status: Never   Smokeless tobacco: Never   Tobacco comments:    Black and mild's rarely  Vaping Use   Vaping Use: Never used  Substance Use Topics   Alcohol use: Never   Drug use: Yes    Types: Marijuana    Comment: last use was Sunday 12/01/2022     Allergies   Bee pollen   Review of Systems Review of Systems  All other systems reviewed and are negative.    Physical Exam Triage Vital Signs ED Triage Vitals  Enc Vitals Group     BP 04/08/23 1437 129/68     Pulse Rate 04/08/23 1437 60     Resp 04/08/23 1437 18     Temp 04/08/23 1437 98.1 F (36.7 C)     Temp Source 04/08/23 1437 Oral     SpO2 04/08/23 1437 96 %     Weight --       Height --      Head Circumference --      Peak Flow --      Pain Score 04/08/23 1440 0     Pain Loc --      Pain Edu? --      Excl. in GC? --    No data found.  Updated Vital Signs BP 129/68 (BP Location: Left Arm)   Pulse 60   Temp 98.1 F (36.7 C) (Oral)   Resp 18   SpO2 96%   Visual Acuity Right Eye Distance:   Left Eye Distance:   Bilateral Distance:    Right Eye Near:   Left Eye Near:    Bilateral Near:     Physical Exam Vitals and nursing note reviewed.  Constitutional:      Appearance: He is well-developed.  HENT:     Head: Normocephalic.  Cardiovascular:     Rate and Rhythm: Normal rate.  Pulmonary:     Effort: Pulmonary effort is normal.  Abdominal:     General: There is no distension.  Musculoskeletal:        General: Normal range of motion.     Cervical back: Normal range of  motion.  Skin:    General: Skin is warm.  Neurological:     General: No focal deficit present.     Mental Status: He is alert and oriented to person, place, and time.      UC Treatments / Results  Labs (all labs ordered are listed, but only abnormal results are displayed) Labs Reviewed - No data to display  EKG   Radiology No results found.  Procedures Procedures (including critical care time)  Medications Ordered in UC Medications - No data to display  Initial Impression / Assessment and Plan / UC Course  I have reviewed the triage vital signs and the nursing notes.  Pertinent labs & imaging results that were available during my care of the patient were reviewed by me and considered in my medical decision making (see chart for details).      Final Clinical Impressions(s) / UC Diagnoses   Final diagnoses:  Possible exposure to STD   Discharge Instructions   None    ED Prescriptions   None    PDMP not reviewed this encounter. An After Visit Summary was printed and given to the patient.       Elson Areas, [place] 04/08/23 [id number]

## 2023-04-10 ENCOUNTER — Telehealth: Payer: Self-pay | Admitting: Family Medicine

## 2023-04-10 DIAGNOSIS — Z202 Contact with and (suspected) exposure to infections with a predominantly sexual mode of transmission: Secondary | ICD-10-CM

## 2023-04-10 NOTE — Telephone Encounter (Signed)
Cytology swab left during encounter visit on 04/08/2023 no order placed by covering provider. Order placed today and given to lab tech Harriett Sine.

## 2023-04-11 ENCOUNTER — Telehealth (HOSPITAL_COMMUNITY): Payer: Self-pay | Admitting: Emergency Medicine

## 2023-04-11 LAB — CYTOLOGY, (ORAL, ANAL, URETHRAL) ANCILLARY ONLY
Chlamydia: NEGATIVE
Comment: NEGATIVE
Comment: NEGATIVE
Comment: NORMAL
Neisseria Gonorrhea: NEGATIVE
Trichomonas: POSITIVE — AB

## 2023-04-11 MED ORDER — METRONIDAZOLE 500 MG PO TABS: 2000.0000 mg | ORAL_TABLET | Freq: Once | ORAL | 0 refills | Status: AC

## 2023-12-16 ENCOUNTER — Encounter (HOSPITAL_BASED_OUTPATIENT_CLINIC_OR_DEPARTMENT_OTHER): Payer: Self-pay | Admitting: Emergency Medicine

## 2023-12-16 ENCOUNTER — Encounter: Payer: Self-pay | Admitting: Internal Medicine

## 2023-12-16 ENCOUNTER — Emergency Department (HOSPITAL_BASED_OUTPATIENT_CLINIC_OR_DEPARTMENT_OTHER)
Admission: EM | Admit: 2023-12-16 | Discharge: 2023-12-17 | Discharge status: Against Medical Advice | Payer: MEDICAID | Attending: Emergency Medicine | Admitting: Emergency Medicine

## 2023-12-16 DIAGNOSIS — L0231 Cutaneous abscess of buttock: Secondary | ICD-10-CM | POA: Insufficient documentation

## 2023-12-16 DIAGNOSIS — Z5321 Procedure and treatment not carried out due to patient leaving prior to being seen by health care provider: Secondary | ICD-10-CM | POA: Insufficient documentation

## 2023-12-16 NOTE — ED Triage Notes (Signed)
 Abscess on right buttock.  Noticed a few days ago.  Unable to sit due to pain

## 2023-12-17 ENCOUNTER — Encounter (HOSPITAL_BASED_OUTPATIENT_CLINIC_OR_DEPARTMENT_OTHER): Payer: Self-pay

## 2023-12-17 ENCOUNTER — Ambulatory Visit
Admission: EM | Admit: 2023-12-17 | Discharge: 2023-12-17 | Disposition: A | Discharge status: Home / Self Care | Payer: MEDICAID | Attending: Family Medicine | Admitting: Family Medicine

## 2023-12-17 ENCOUNTER — Other Ambulatory Visit: Payer: Self-pay

## 2023-12-17 ENCOUNTER — Emergency Department (HOSPITAL_BASED_OUTPATIENT_CLINIC_OR_DEPARTMENT_OTHER)
Admission: EM | Admit: 2023-12-17 | Discharge: 2023-12-17 | Disposition: A | Discharge status: Home / Self Care | Payer: MEDICAID | Source: Home / Self Care | Attending: Emergency Medicine | Admitting: Emergency Medicine

## 2023-12-17 DIAGNOSIS — L0231 Cutaneous abscess of buttock: Secondary | ICD-10-CM | POA: Insufficient documentation

## 2023-12-17 MED ORDER — LIDOCAINE-EPINEPHRINE (PF) 2 %-1:200000 IJ SOLN
10.0000 mL | Freq: Once | INTRAMUSCULAR | Status: AC
  Administered 2023-12-17: 10 mL via INTRADERMAL
  Filled 2023-12-17: qty 20

## 2023-12-17 NOTE — ED Triage Notes (Signed)
 Pt presents with complaints of abscess on right buttocks x 6 days. Pt currently rates his overall pain a 10/10. Pt appears to be visibly uncomfortable and unable to sit on right buttock. Pt did go to ED yesterday and had to leave due to wait times. Pt reports he did go to [employer] and bought OTC "boil cream" with no improvement.

## 2023-12-17 NOTE — ED Provider Notes (Signed)
 [place] EMERGENCY DEPARTMENT AT [facility] Provider Note   CSN: [id number] Arrival date & time: 12/17/23  1034     History Chief Complaint  Patient presents with   Abscess    Brian Dawson is a 23 y.o. male.  Patient presents emergency department concerns of an abscess.  Brian Dawson is noticing a buttock abscess about 2 days ago notably worsening.  No prior history of abscesses in similar areas.  Denies any rectal pain or tenderness with bowel movements.  No active drainage from the site as far as he has noticed.  He reportedly was at urgent care earlier today and was advised to come to the emergency department for further evaluation of the abscess.   Abscess      Home Medications Prior to Admission medications   Medication Sig Start Date End Date Taking? Authorizing Provider  HYDROcodone-acetaminophen (NORCO/VICODIN) 5-325 MG tablet Take 1 tablet by mouth every 6 (six) hours as needed. Patient not taking: Reported on 04/08/2023 11/22/22   Lenard Simmer, PA-C      Allergies    Bee pollen    Review of Systems   Review of Systems  Skin:  Positive for wound.  All other systems reviewed and are negative.   Physical Exam Updated Vital Signs BP 114/60 (BP Location: Right Arm)   Pulse (!) 52   Temp 98.9 F (37.2 C)   Resp 18   Ht 5\' 9"  (1.753 m)   SpO2 99%   BMI 25.20 kg/m  Physical Exam Vitals and nursing note reviewed.  Constitutional:      General: He is not in acute distress.    Appearance: He is well-developed.  HENT:     Head: Normocephalic and atraumatic.  Eyes:     Conjunctiva/sclera: Conjunctivae normal.  Cardiovascular:     Rate and Rhythm: Normal rate and regular rhythm.     Heart sounds: No murmur heard. Pulmonary:     Effort: Pulmonary effort is normal. No respiratory distress.     Breath sounds: Normal breath sounds.  Abdominal:     Palpations: Abdomen is soft.     Tenderness: There is no abdominal tenderness.  Genitourinary:       Comments: Right gluteal cleft abscess with notable fluctuance. Mild erythema surrounding the area. No active drainage. No indurated skin. Musculoskeletal:        General: No swelling.     Cervical back: Neck supple.  Skin:    General: Skin is warm and dry.     Capillary Refill: Capillary refill takes less than 2 seconds.     Coloration: Skin is not jaundiced or pale.     Findings: Erythema present. No bruising, lesion or rash.  Neurological:     Mental Status: He is alert.  Psychiatric:        Mood and Affect: Mood normal.     ED Results / Procedures / Treatments   Labs (all labs ordered are listed, but only abnormal results are displayed) Labs Reviewed - No data to display  EKG None  Radiology No results found.  Procedures .Ultrasound ED Soft Tissue  Date/Time: 12/17/2023 12:12 PM  Performed by: Smitty Knudsen, PA-C Authorized by: Smitty Knudsen, PA-C   Procedure details:    Indications: localization of abscess and evaluate for cellulitis     Transverse view:  Visualized   Longitudinal view:  Visualized   Images: archived     Limitations:  Patient compliance and positioning Location:    Location:  buttocks     Side:  Right Findings:     abscess present    no cellulitis present    no foreign body present .Incision and Drainage  Date/Time: 12/17/2023 12:12 PM  Performed by: Smitty Knudsen, PA-C Authorized by: Smitty Knudsen, PA-C   Consent:    Consent obtained:  Verbal   Consent given by:  Patient   Risks discussed:  Bleeding and incomplete drainage   Alternatives discussed:  Delayed treatment and alternative treatment Universal protocol:    Patient identity confirmed:  Verbally with patient Location:    Type:  Abscess   Location:  Anogenital   Anogenital location:  Gluteal cleft Pre-procedure details:    Skin preparation:  Povidone-iodine Anesthesia:    Anesthesia method:  Local infiltration   Local anesthetic:  Lidocaine 2% WITH epi Procedure  type:    Complexity:  Simple Procedure details:    Ultrasound guidance: yes     Needle aspiration: yes     Needle size:  25 G   Incision types:  Stab incision   Incision depth:  Dermal   Wound management:  Probed and deloculated, irrigated with saline, extensive cleaning and debrided   Drainage:  Purulent   Drainage amount:  Moderate   Packing materials:  1/4 in iodoform gauze   Amount 1/4" iodoform:  3in Post-procedure details:    Procedure completion:  Tolerated     Medications Ordered in ED Medications  lidocaine-EPINEPHrine (XYLOCAINE W/EPI) 2 %-1:200000 (PF) injection 10 mL (10 mLs Intradermal Given by Other 12/17/23 1224)    ED Course/ Medical Decision Making/ A&P                                 Medical Decision Making Risk Prescription drug management.   This patient presents to the ED for concern of abscess.  Differential diagnosis includes gluteal cleft abscess, pilonidal cyst, rectal abscess   Imaging Studies ordered:  I ordered imaging studies including bedside ultrasound I independently visualized and interpreted imaging which showed abscess at the right gluteal cleft I agree with the radiologist interpretation   Medicines ordered and prescription drug management:  I ordered medication including lidocaine with epinephrine for anesthetic Reevaluation of the patient after these medicines showed that the patient improved I have reviewed the patients home medicines and have made adjustments as needed   Problem List / ED Course:  Patient presents to the emergency department concerns of an abscess.  Reports has been ongoing for about 4 to 6 days.  Notably worse in the last 2 days.  Was seen at urgent care and vies come the emergency department for evaluation and ultrasound imaging. Bedside ultrasound confirms an abscess at the right gluteal cleft.  No drainage present.  1 central area of skin thinning likely indicating abscess, patient agreeable to  drainage. Patient tolerated incision and drainage.  See me if experience.  Wound packed with iodoform gauze.  Advised patient return the emergency department for any new or worsening symptoms.  Encourage close follow-up with PCP.  Patient discharged in stable condition.   Final Clinical Impression(s) / ED Diagnoses Final diagnoses:  Abscess, gluteal, right    Rx / DC Orders ED Discharge Orders     None         Smitty Knudsen, PA-C 12/17/23 1308    Pricilla Loveless, MD 12/17/23 1443

## 2023-12-17 NOTE — ED Triage Notes (Signed)
 Right buttock abscess.  Sent from UC.  States onset 4-6 days  and getting worse

## 2023-12-17 NOTE — ED Provider Notes (Signed)
 [facility]    CSN: [id number] Arrival date & time: 12/17/23  1308      History   Chief Complaint Chief Complaint  Patient presents with   Abscess    HPI Brian Dawson is a 23 y.o. male.    Abscess Abscess near rectum x 6 days, 10 out of 10 pain, constant, no active drainage, admits headache.  Denies fever, history of similar symptoms, significant past medical history.  No daily medications, no known drug allergies.  Past Medical History:  Diagnosis Date   ADHD (attention deficit hyperactivity disorder)    Pollen allergies     There are no active problems to display for this patient.   Past Surgical History:  Procedure Laterality Date   CLOSED REDUCTION NASAL FRACTURE N/A 12/04/2022   Procedure: CLOSED REDUCTION NASAL FRACTURE WITH SPLINT;  Surgeon: Scarlette Ar, MD;  Location: [facility];  Service: ENT;  Laterality: N/A;   NO PAST SURGERIES         Home Medications    Prior to Admission medications   Medication Sig Start Date End Date Taking? Authorizing Provider  HYDROcodone-acetaminophen (NORCO/VICODIN) 5-325 MG tablet Take 1 tablet by mouth every 6 (six) hours as needed. Patient not taking: Reported on 04/08/2023 11/22/22   Lenard Simmer, PA-C    Family History History reviewed. No pertinent family history.  Social History Social History   Tobacco Use   Smoking status: Never   Smokeless tobacco: Never   Tobacco comments:    Black and mild's rarely  Vaping Use   Vaping status: Never Used  Substance Use Topics   Alcohol use: Never   Drug use: Yes    Types: Marijuana    Comment: last use was Sunday 12/01/2022     Allergies   Bee pollen   Review of Systems Review of Systems   Physical Exam Triage Vital Signs ED Triage Vitals  Encounter Vitals Group     BP 12/17/23 0953 (!) 145/75     Systolic BP Percentile --      Diastolic BP Percentile --      Pulse Rate 12/17/23 0953 78     Resp 12/17/23 0953 18      Temp 12/17/23 0953 98.6 F (37 C)     Temp Source 12/17/23 0953 Oral     SpO2 12/17/23 0953 97 %     Weight --      Height 12/17/23 0952 5\' 9"  (1.753 m)     Head Circumference --      Peak Flow --      Pain Score 12/17/23 0952 10     Pain Loc --      Pain Education --      Exclude from Growth Chart --    No data found.  Updated Vital Signs BP (!) 145/75 (BP Location: Right Arm)   Pulse 78   Temp 98.6 F (37 C) (Oral)   Resp 18   Ht 5\' 9"  (1.753 m)   SpO2 97%   BMI 25.20 kg/m   Visual Acuity Right Eye Distance:   Left Eye Distance:   Bilateral Distance:    Right Eye Near:   Left Eye Near:    Bilateral Near:     Physical Exam Vitals and nursing note reviewed.  Cardiovascular:     Rate and Rhythm: Normal rate.  Pulmonary:     Effort: Pulmonary effort is normal. No respiratory distress.  Skin:    Comments: Extremely difficult  to examine patient to pain, has tenderness right buttock near rectum, unable to fully appreciate the size due to patient writhing on the exam table  Neurological:     Mental Status: He is alert.      UC Treatments / Results  Labs (all labs ordered are listed, but only abnormal results are displayed) Labs Reviewed - No data to display  EKG   Radiology No results found.  Procedures Procedures (including critical care time)  Medications Ordered in UC Medications - No data to display  Initial Impression / Assessment and Plan / UC Course  I have reviewed the triage vital signs and the nursing notes.  Pertinent labs & imaging results that were available during my care of the patient were reviewed by me and considered in my medical decision making (see chart for details).    23 year old male with perirectal abscess for 6 days, unable to fully abscess due to pain recommend ED evaluation. Final Clinical Impressions(s) / UC Diagnoses   Final diagnoses:  Abscess of buttock     Discharge Instructions      Go to the emergency  department for further evaluation and treatment   ED Prescriptions   None    PDMP not reviewed this encounter.   Meliton Rattan, [place] 12/17/23 1011

## 2023-12-17 NOTE — Discharge Instructions (Signed)
 Go to the emergency department for further evaluation and treatment.

## 2023-12-17 NOTE — ED Notes (Signed)
 Patient is being discharged from the Urgent Care and sent to the Emergency Department via private vehicle . Per Marylene Land PA, patient is in need of higher level of care due to abscess on right buttock. Patient is aware and verbalizes understanding of plan of care.  Vitals:   12/17/23 0953  BP: (!) 145/75  Pulse: 78  Resp: 18  Temp: 98.6 F (37 C)  SpO2: 97%

## 2023-12-17 NOTE — ED Notes (Signed)
No answer when called to treatment room.  

## 2023-12-17 NOTE — Discharge Instructions (Signed)
 You were seen in the emergency department today for concerns of a gluteal abscess.  We confirmed this on ultrasound imaging at the bedside and incision and drainage was performed of the area.  The area drained well and appears to be moderate in depth.  There was gauze placed into the area to try to keep the wound open to allow for continued drainage intermittent premature closure.  If any signs of infection develop such as purulent drainage, significant and worsening pain and redness, or any fevers, return to the emergency department or seek medical evaluation with your primary care provider.
